# Patient Record
Sex: Female | Born: 1955
Health system: Southern US, Community
[De-identification: ages and names within clinical notes are randomized; demographics above are authoritative.]

## PROBLEM LIST (undated history)

## (undated) DIAGNOSIS — C801 Malignant (primary) neoplasm, unspecified: Secondary | ICD-10-CM

## (undated) HISTORY — PX: TUBAL LIGATION: SHX77

## (undated) HISTORY — PX: BREAST SURGERY: SHX581

## (undated) HISTORY — PX: ABDOMINAL HYSTERECTOMY: SHX81

## (undated) HISTORY — DX: Malignant (primary) neoplasm, unspecified: C80.1

---

## 2020-11-28 DIAGNOSIS — Z0000 Encounter for general adult medical examination without abnormal findings: Secondary | ICD-10-CM | POA: Diagnosis not present

## 2020-11-28 DIAGNOSIS — Z13220 Encounter for screening for lipoid disorders: Secondary | ICD-10-CM | POA: Diagnosis not present

## 2020-12-21 ENCOUNTER — Other Ambulatory Visit: Payer: Self-pay | Admitting: Family Medicine

## 2020-12-21 DIAGNOSIS — M810 Age-related osteoporosis without current pathological fracture: Secondary | ICD-10-CM

## 2020-12-27 ENCOUNTER — Ambulatory Visit
Admission: RE | Admit: 2020-12-27 | Discharge: 2020-12-27 | Disposition: A | Discharge status: Home / Self Care | Payer: BC Managed Care – PPO | Source: Ambulatory Visit | Attending: Family Medicine | Admitting: Family Medicine

## 2020-12-27 ENCOUNTER — Other Ambulatory Visit: Payer: Self-pay

## 2020-12-27 DIAGNOSIS — M810 Age-related osteoporosis without current pathological fracture: Secondary | ICD-10-CM | POA: Diagnosis not present

## 2020-12-27 DIAGNOSIS — M8588 Other specified disorders of bone density and structure, other site: Secondary | ICD-10-CM | POA: Diagnosis not present

## 2020-12-27 DIAGNOSIS — Z780 Asymptomatic menopausal state: Secondary | ICD-10-CM | POA: Diagnosis not present

## 2021-01-10 DIAGNOSIS — Z01818 Encounter for other preprocedural examination: Secondary | ICD-10-CM | POA: Diagnosis not present

## 2021-01-15 DIAGNOSIS — Z1211 Encounter for screening for malignant neoplasm of colon: Secondary | ICD-10-CM | POA: Diagnosis not present

## 2021-02-20 DIAGNOSIS — H5213 Myopia, bilateral: Secondary | ICD-10-CM | POA: Diagnosis not present

## 2021-02-20 DIAGNOSIS — H2513 Age-related nuclear cataract, bilateral: Secondary | ICD-10-CM | POA: Diagnosis not present

## 2021-02-20 DIAGNOSIS — H524 Presbyopia: Secondary | ICD-10-CM | POA: Diagnosis not present

## 2021-04-11 DIAGNOSIS — Z01419 Encounter for gynecological examination (general) (routine) without abnormal findings: Secondary | ICD-10-CM | POA: Diagnosis not present

## 2021-06-03 ENCOUNTER — Emergency Department (HOSPITAL_COMMUNITY): Payer: BC Managed Care – PPO

## 2021-06-03 ENCOUNTER — Other Ambulatory Visit: Payer: Self-pay

## 2021-06-03 ENCOUNTER — Emergency Department (HOSPITAL_COMMUNITY)
Admission: EM | Admit: 2021-06-03 | Discharge: 2021-06-03 | Disposition: A | Discharge status: Home / Self Care | Payer: BC Managed Care – PPO | Attending: Emergency Medicine | Admitting: Emergency Medicine

## 2021-06-03 ENCOUNTER — Encounter (HOSPITAL_COMMUNITY): Payer: Self-pay

## 2021-06-03 DIAGNOSIS — Z859 Personal history of malignant neoplasm, unspecified: Secondary | ICD-10-CM | POA: Diagnosis not present

## 2021-06-03 DIAGNOSIS — S59902A Unspecified injury of left elbow, initial encounter: Secondary | ICD-10-CM | POA: Diagnosis not present

## 2021-06-03 DIAGNOSIS — S52135A Nondisplaced fracture of neck of left radius, initial encounter for closed fracture: Secondary | ICD-10-CM | POA: Diagnosis not present

## 2021-06-03 DIAGNOSIS — W010XXA Fall on same level from slipping, tripping and stumbling without subsequent striking against object, initial encounter: Secondary | ICD-10-CM | POA: Insufficient documentation

## 2021-06-03 DIAGNOSIS — S50312A Abrasion of left elbow, initial encounter: Secondary | ICD-10-CM

## 2021-06-03 DIAGNOSIS — S52125A Nondisplaced fracture of head of left radius, initial encounter for closed fracture: Secondary | ICD-10-CM | POA: Diagnosis not present

## 2021-06-03 DIAGNOSIS — S52132A Displaced fracture of neck of left radius, initial encounter for closed fracture: Secondary | ICD-10-CM | POA: Diagnosis not present

## 2021-06-03 IMAGING — CR DG ELBOW COMPLETE 3+V*L*
4 series · 4 of 4 positions shown · non-contrast
Comparison: None.

CLINICAL DATA: Left elbow pain after fall.

EXAM:
LEFT ELBOW - COMPLETE 3+ VIEW

[x elbow obl left (1 of 2)]
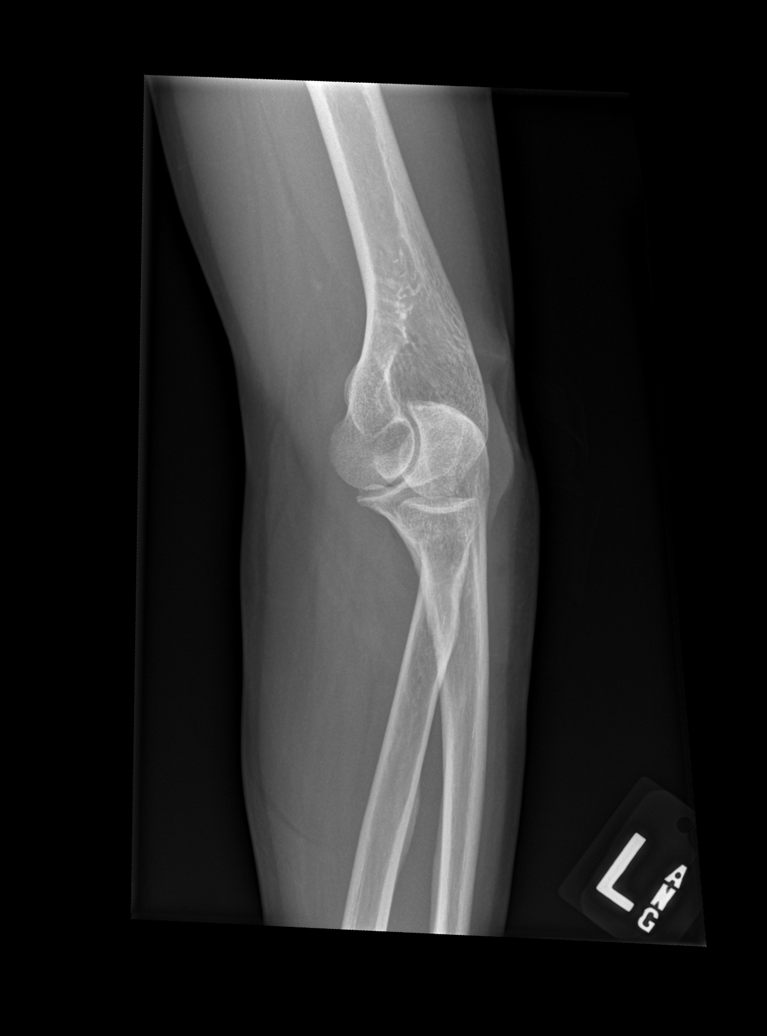

[x elbow lat left]
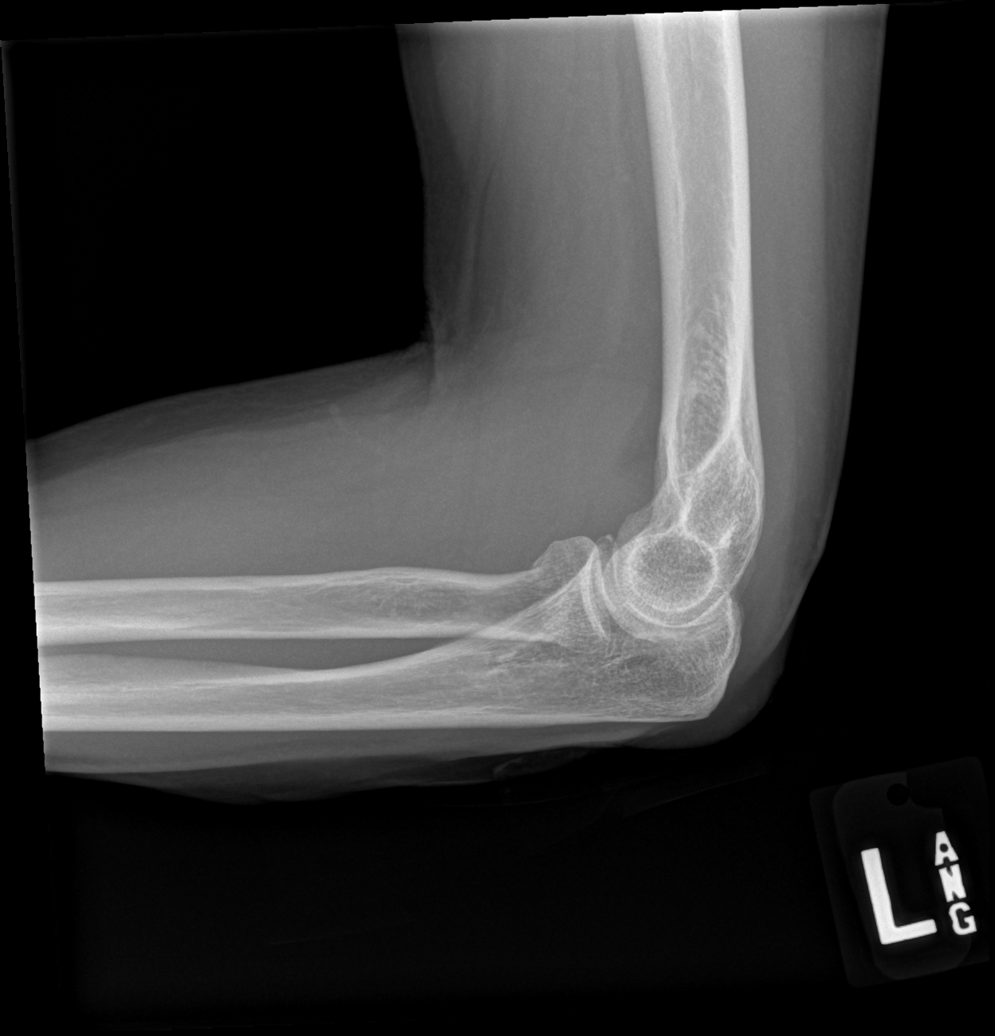

[x elbow ap left]
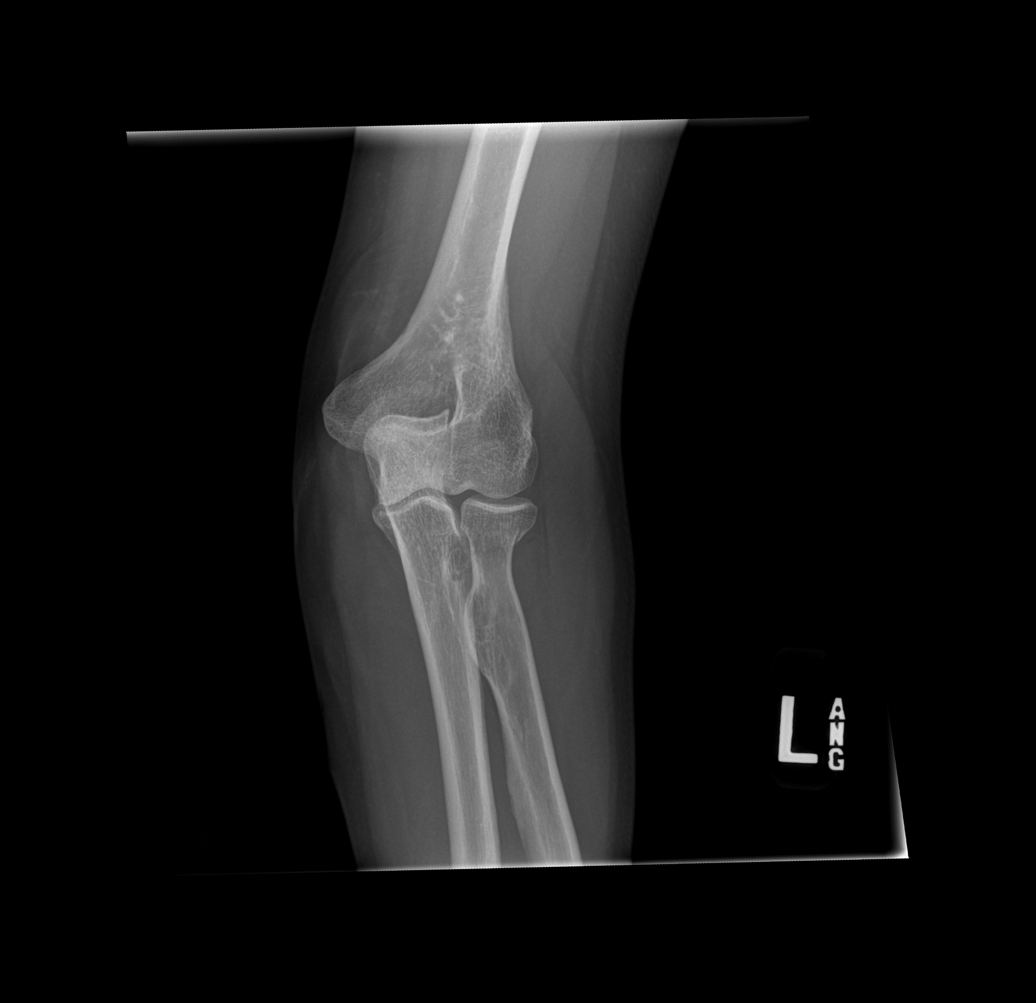

[x elbow obl left (2 of 2)]
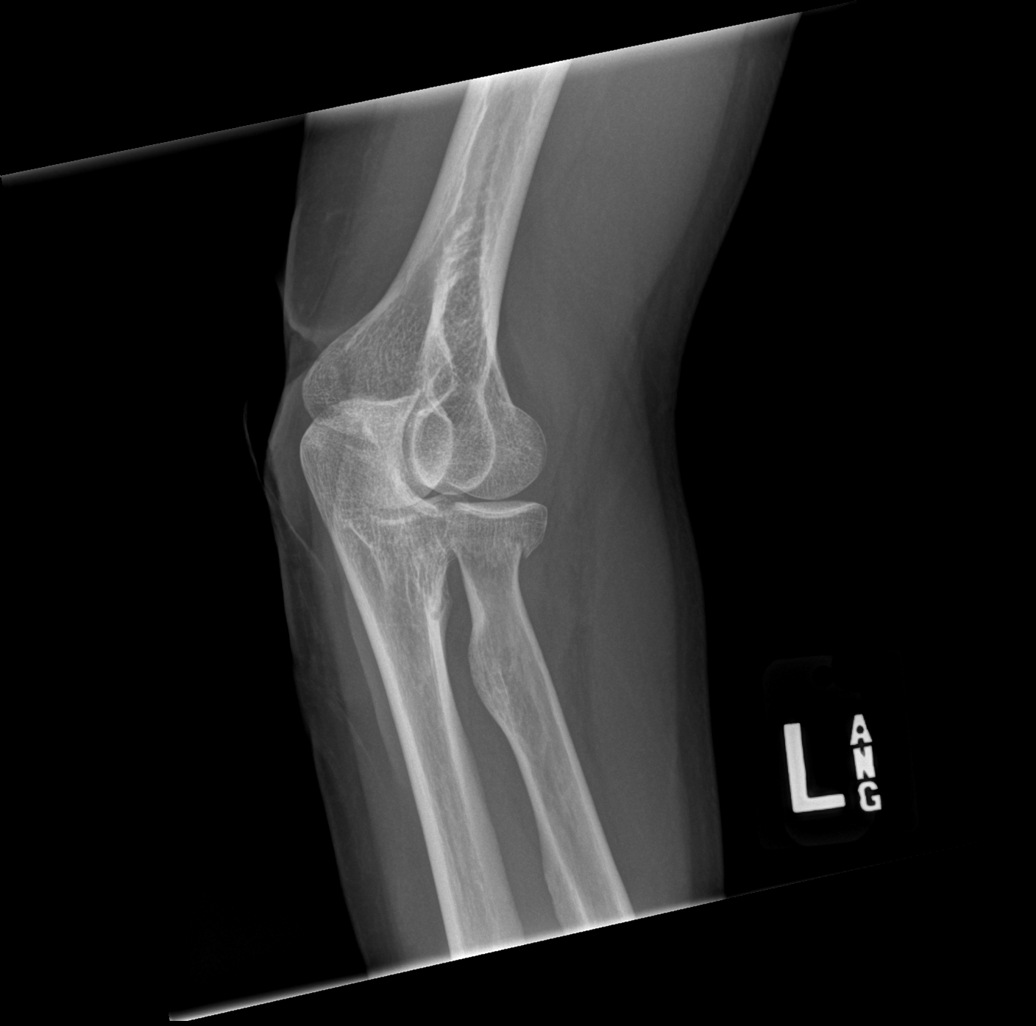

[4 of 4 positions shown; findings below may reference images not displayed]

FINDINGS: Acute minimally impacted fracture of the radial neck. No
dislocation. Small joint effusion. Joint spaces are preserved. Soft
tissues are unremarkable.
IMPRESSION: 1. Acute minimally impacted fracture of the radial neck.

## 2021-06-03 MED ORDER — HYDROCODONE-ACETAMINOPHEN 10-300 MG PO TABS: 1.0000 | ORAL_TABLET | Freq: Four times a day (QID) | ORAL | 0 refills | Status: AC | PRN

## 2021-06-03 NOTE — ED Notes (Signed)
Patient transported to X-ray 

## 2021-06-03 NOTE — ED Provider Notes (Addendum)
MSE note.  Patient fell and injured her left elbow.  Physical exam patient has an abrasion to the left elbow and has decreased strenght gripping objects.  We will get an x-ray of her elbow but it does not seem to be fractured or dislocated   Milton Ferguson, MD 06/03/21 1645    Milton Ferguson, MD 06/03/21 [phone]

## 2021-06-03 NOTE — ED Provider Notes (Signed)
[facility] DEPT Provider Note   CSN: [id number] Arrival date & time: 06/03/21  1624     History No chief complaint on file.   Haley Castillo is a 65 y.o. female.  She fell outside in the yard and landed on her left elbow and scraped her left knee.  No loss of consciousness.  She says it hurts when she moves her arm and specific directions.  She has an abrasion over her elbow.  Her tetanus is up-to-date.  No numbness or weakness.  No head or neck pain.   The history is provided by the patient.  Arm Injury Location:  Elbow Elbow location:  L elbow Injury: yes   Time since incident:  1 hour Mechanism of injury: fall   Fall:    Fall occurred:  [employer] of impact: l elbow. Pain details:    Quality:  Shooting   Radiates to:  L upper arm and L forearm   Severity:  Moderate   Onset quality:  Sudden   Timing:  Intermittent   Progression:  Unchanged Handedness:  Right-handed Dislocation: no   Prior injury to area:  No Relieved by:  None tried Worsened by:  Movement Ineffective treatments:  None tried Associated symptoms: no decreased range of motion, no fever, no muscle weakness, no neck pain, no numbness and no tingling       Past Medical History:  Diagnosis Date   Cancer ([place])     There are no problems to display for this patient.   Past Surgical History:  Procedure Laterality Date   ABDOMINAL HYSTERECTOMY     BREAST SURGERY     TUBAL LIGATION       OB History   No obstetric history on file.     History reviewed. No pertinent family history.  Social History   Tobacco Use   Smoking status: Never   Smokeless tobacco: Never  Vaping Use   Vaping Use: Never used  Substance Use Topics   Alcohol use: Yes   Drug use: Never    Home Medications Prior to Admission medications   Not on File    Allergies    Patient has no known allergies.  Review of Systems   Review of Systems  Constitutional:  Negative for  fever.  Musculoskeletal:  Negative for neck pain.  Skin:  Positive for wound.  Neurological:  Negative for weakness and numbness.   Physical Exam Updated Vital Signs BP (!) 153/106 (BP Location: Left Arm)   Pulse 97   Temp 98.1 F (36.7 C) (Oral)   Resp 14   Ht 5' 3.5" (1.613 m)   Wt 49.9 kg   LMP  (Exact Date)   SpO2 100%   BMI 19.18 kg/m   Physical Exam Constitutional:      Appearance: Normal appearance. She is well-developed.  HENT:     Head: Normocephalic and atraumatic.  Eyes:     Conjunctiva/sclera: Conjunctivae normal.  Musculoskeletal:        General: Tenderness and signs of injury present. No swelling. Normal range of motion.     Cervical back: Neck supple.     Comments: Since left shoulder and upper arm are nontender.  Elbow is a little bit tender at the radial head and olecranon.  She has a small abrasion over her elbow.  Forearm and wrist and hand are nontender.  Normal radial ulnar and median motor and sensory intact.  Radial pulse 2+.  Skin:  General: Skin is warm and dry.  Neurological:     General: No focal deficit present.     Mental Status: She is alert.     GCS: GCS eye subscore is 4. GCS verbal subscore is 5. GCS motor subscore is 6.    ED Results / Procedures / Treatments   Labs (all labs ordered are listed, but only abnormal results are displayed) Labs Reviewed - No data to display  EKG None  Radiology DG Elbow Complete Left  Result Date: 06/03/2021 CLINICAL DATA:  Left elbow pain after fall. EXAM: LEFT ELBOW - COMPLETE 3+ VIEW COMPARISON:  None. FINDINGS: Acute minimally impacted fracture of the radial neck. No dislocation. Small joint effusion. Joint spaces are preserved. Soft tissues are unremarkable. IMPRESSION: 1. Acute minimally impacted fracture of the radial neck. Electronically Signed   By: Titus Dubin M.D.   On: 06/03/2021 17:06    Procedures Procedures   Medications Ordered in ED Medications - No data to display  ED  Course  I have reviewed the triage vital signs and the nursing notes.  Pertinent labs & imaging results that were available during my care of the patient were reviewed by me and considered in my medical decision making (see chart for details).  Clinical Course as of 06/04/21 6203  Sun Jun 03, 2021  1725 Discussed with Dr. Alvan Dame orthopedics on-call.  He said a sling would be fine and have the patient follow-up this week with either Dr. Apolonio Schneiders or Dr. Amedeo Plenty. [MB]    Clinical Course User Index [MB] Hayden Rasmussen, MD   MDM Rules/Calculators/A&P                         Left elbow pain after fall.  Differential includes fracture, dislocation, contusion.  Left elbow x-ray ordered and interpreted by me as radial neck fracture.  Discussed with orthopedics.  Reviewed with patient recommendations per orthopedics, sling and outpatient follow-up.  Provide short prescription for some pain medication if needed.  Return instructions discussed.  Final Clinical Impression(s) / ED Diagnoses Final diagnoses:  Closed nondisplaced fracture of neck of left radius, initial encounter  Abrasion of left elbow, initial encounter    Rx / DC Orders ED Discharge Orders     None        Hayden Rasmussen, MD 06/04/21 0930

## 2021-06-03 NOTE — Discharge Instructions (Addendum)
You are seen in the emergency room for evaluation of injuries from a fall.  You had x-rays of your left elbow that showed a radial neck fracture.  Orthopedics on-call recommends you call the office Monday to get seen early this week by either Dr. Amedeo Plenty or Dr. Apolonio Schneiders.  Tylenol and ibuprofen for pain.  Ice may also help.  Sling for comfort.

## 2021-06-03 NOTE — ED Triage Notes (Addendum)
Patient reports that she tripped over a small fence in her backward and fell, landing on her left elbow and knee. patient did not hit her head or have LOC. Patient has an abrasion to he left knee and left elbow.Patient states she is able to bend her left arm at the elbow, but is unable to grip with her left hand.

## 2021-06-05 ENCOUNTER — Telehealth (HOSPITAL_COMMUNITY): Payer: Self-pay | Admitting: Emergency Medicine

## 2021-06-05 MED ORDER — OXYCODONE-ACETAMINOPHEN 10-325 MG PO TABS
0.5000 | ORAL_TABLET | Freq: Four times a day (QID) | ORAL | 0 refills | Status: AC | PRN
Start: 2021-06-05 — End: 2021-06-08

## 2021-06-05 NOTE — Telephone Encounter (Signed)
This encounter was created to provide alternative medication.  The medicine that was originally prescribed for patient is no longer available in pharmacy.  Equivalent will be sent in.

## 2021-06-06 DIAGNOSIS — S52135A Nondisplaced fracture of neck of left radius, initial encounter for closed fracture: Secondary | ICD-10-CM | POA: Diagnosis not present

## 2021-06-28 DIAGNOSIS — M25522 Pain in left elbow: Secondary | ICD-10-CM | POA: Diagnosis not present

## 2021-06-28 DIAGNOSIS — S52135D Nondisplaced fracture of neck of left radius, subsequent encounter for closed fracture with routine healing: Secondary | ICD-10-CM | POA: Diagnosis not present

## 2021-07-12 DIAGNOSIS — S52135D Nondisplaced fracture of neck of left radius, subsequent encounter for closed fracture with routine healing: Secondary | ICD-10-CM | POA: Diagnosis not present

## 2021-08-09 DIAGNOSIS — S52135D Nondisplaced fracture of neck of left radius, subsequent encounter for closed fracture with routine healing: Secondary | ICD-10-CM | POA: Diagnosis not present

## 2021-12-17 DIAGNOSIS — Z13220 Encounter for screening for lipoid disorders: Secondary | ICD-10-CM | POA: Diagnosis not present

## 2021-12-17 DIAGNOSIS — R030 Elevated blood-pressure reading, without diagnosis of hypertension: Secondary | ICD-10-CM | POA: Diagnosis not present

## 2021-12-17 DIAGNOSIS — Z0000 Encounter for general adult medical examination without abnormal findings: Secondary | ICD-10-CM | POA: Diagnosis not present

## 2022-02-21 DIAGNOSIS — H5213 Myopia, bilateral: Secondary | ICD-10-CM | POA: Diagnosis not present

## 2022-02-21 DIAGNOSIS — H2513 Age-related nuclear cataract, bilateral: Secondary | ICD-10-CM | POA: Diagnosis not present

## 2022-03-22 DIAGNOSIS — Z1211 Encounter for screening for malignant neoplasm of colon: Secondary | ICD-10-CM | POA: Diagnosis not present

## 2022-03-22 DIAGNOSIS — K5730 Diverticulosis of large intestine without perforation or abscess without bleeding: Secondary | ICD-10-CM | POA: Diagnosis not present

## 2022-03-22 DIAGNOSIS — K649 Unspecified hemorrhoids: Secondary | ICD-10-CM | POA: Diagnosis not present

## 2022-04-15 DIAGNOSIS — Z01419 Encounter for gynecological examination (general) (routine) without abnormal findings: Secondary | ICD-10-CM | POA: Diagnosis not present

## 2023-01-14 DIAGNOSIS — R7301 Impaired fasting glucose: Secondary | ICD-10-CM | POA: Diagnosis not present

## 2023-01-14 DIAGNOSIS — Z0000 Encounter for general adult medical examination without abnormal findings: Secondary | ICD-10-CM | POA: Diagnosis not present

## 2023-01-14 DIAGNOSIS — Z23 Encounter for immunization: Secondary | ICD-10-CM | POA: Diagnosis not present

## 2023-01-14 DIAGNOSIS — Z13220 Encounter for screening for lipoid disorders: Secondary | ICD-10-CM | POA: Diagnosis not present

## 2023-01-14 DIAGNOSIS — R030 Elevated blood-pressure reading, without diagnosis of hypertension: Secondary | ICD-10-CM | POA: Diagnosis not present

## 2023-01-14 DIAGNOSIS — F419 Anxiety disorder, unspecified: Secondary | ICD-10-CM | POA: Diagnosis not present

## 2023-02-26 DIAGNOSIS — H2513 Age-related nuclear cataract, bilateral: Secondary | ICD-10-CM | POA: Diagnosis not present

## 2023-02-26 DIAGNOSIS — H5213 Myopia, bilateral: Secondary | ICD-10-CM | POA: Diagnosis not present

## 2023-02-26 DIAGNOSIS — H524 Presbyopia: Secondary | ICD-10-CM | POA: Diagnosis not present

## 2023-06-02 ENCOUNTER — Other Ambulatory Visit (HOSPITAL_COMMUNITY): Payer: Self-pay

## 2023-06-02 MED ORDER — HYDROCHLOROTHIAZIDE 25 MG PO TABS
25.0000 mg | ORAL_TABLET | Freq: Every morning | ORAL | 1 refills | Status: AC
  Filled 2023-06-02: qty 30, 30d supply, fill #0
  Filled 2023-07-01: qty 30, 30d supply, fill #1
  Filled 2023-07-24: qty 30, 30d supply, fill #2

## 2023-07-24 ENCOUNTER — Other Ambulatory Visit (HOSPITAL_COMMUNITY): Payer: Self-pay

## 2023-07-25 ENCOUNTER — Other Ambulatory Visit (HOSPITAL_COMMUNITY): Payer: Self-pay

## 2023-08-13 DIAGNOSIS — Z01419 Encounter for gynecological examination (general) (routine) without abnormal findings: Secondary | ICD-10-CM | POA: Diagnosis not present

## 2023-08-27 ENCOUNTER — Other Ambulatory Visit (HOSPITAL_COMMUNITY): Payer: Self-pay

## 2023-08-27 DIAGNOSIS — Z23 Encounter for immunization: Secondary | ICD-10-CM | POA: Diagnosis not present

## 2023-08-27 DIAGNOSIS — I10 Essential (primary) hypertension: Secondary | ICD-10-CM | POA: Diagnosis not present

## 2023-08-27 DIAGNOSIS — F419 Anxiety disorder, unspecified: Secondary | ICD-10-CM | POA: Diagnosis not present

## 2023-08-27 MED ORDER — LORAZEPAM 0.5 MG PO TABS
0.2500 mg | ORAL_TABLET | Freq: Every day | ORAL | 0 refills | Status: AC | PRN
  Filled 2023-08-27: qty 15, 15d supply, fill #0

## 2023-08-27 MED ORDER — AMLODIPINE BESYLATE 5 MG PO TABS
5.0000 mg | ORAL_TABLET | Freq: Every day | ORAL | 0 refills | Status: AC
  Filled 2023-08-27: qty 30, 30d supply, fill #0

## 2023-08-28 ENCOUNTER — Other Ambulatory Visit (HOSPITAL_COMMUNITY): Payer: Self-pay

## 2023-09-06 ENCOUNTER — Other Ambulatory Visit: Payer: Self-pay | Admitting: Obstetrics and Gynecology

## 2023-09-06 DIAGNOSIS — R5381 Other malaise: Secondary | ICD-10-CM

## 2023-09-26 ENCOUNTER — Other Ambulatory Visit: Payer: Self-pay | Admitting: Obstetrics and Gynecology

## 2023-09-26 ENCOUNTER — Other Ambulatory Visit (HOSPITAL_COMMUNITY): Payer: Self-pay

## 2023-09-26 DIAGNOSIS — M810 Age-related osteoporosis without current pathological fracture: Secondary | ICD-10-CM

## 2023-09-26 DIAGNOSIS — I10 Essential (primary) hypertension: Secondary | ICD-10-CM | POA: Diagnosis not present

## 2023-09-26 MED ORDER — AMLODIPINE BESYLATE 10 MG PO TABS
10.0000 mg | ORAL_TABLET | Freq: Every day | ORAL | 0 refills | Status: DC
  Filled 2023-09-26: qty 30, 30d supply, fill #0

## 2023-09-29 ENCOUNTER — Other Ambulatory Visit (HOSPITAL_COMMUNITY): Payer: Self-pay

## 2023-10-07 ENCOUNTER — Other Ambulatory Visit (HOSPITAL_BASED_OUTPATIENT_CLINIC_OR_DEPARTMENT_OTHER): Payer: Self-pay

## 2023-10-07 ENCOUNTER — Other Ambulatory Visit (HOSPITAL_COMMUNITY): Payer: Self-pay

## 2023-10-07 MED ORDER — FUROSEMIDE 20 MG PO TABS
20.0000 mg | ORAL_TABLET | Freq: Every day | ORAL | 0 refills | Status: AC
  Filled 2023-10-07: qty 30, 30d supply, fill #0

## 2023-10-27 ENCOUNTER — Other Ambulatory Visit (HOSPITAL_COMMUNITY): Payer: Self-pay

## 2023-10-27 MED ORDER — FUROSEMIDE 20 MG PO TABS
20.0000 mg | ORAL_TABLET | Freq: Two times a day (BID) | ORAL | 1 refills | Status: AC
  Filled 2023-10-27: qty 60, 30d supply, fill #0

## 2023-10-27 MED ORDER — AMLODIPINE BESYLATE 10 MG PO TABS
10.0000 mg | ORAL_TABLET | Freq: Every day | ORAL | 1 refills | Status: DC
  Filled 2023-10-27: qty 30, 30d supply, fill #0

## 2023-10-28 ENCOUNTER — Other Ambulatory Visit (HOSPITAL_COMMUNITY): Payer: Self-pay

## 2023-10-29 ENCOUNTER — Other Ambulatory Visit (HOSPITAL_COMMUNITY): Payer: Self-pay

## 2023-11-07 ENCOUNTER — Other Ambulatory Visit (HOSPITAL_COMMUNITY): Payer: Self-pay

## 2023-11-07 MED ORDER — COVID-19 MRNA VAC-TRIS(PFIZER) 30 MCG/0.3ML IM SUSY
0.3000 mL | PREFILLED_SYRINGE | Freq: Once | INTRAMUSCULAR | 0 refills | Status: AC
  Filled 2023-11-07: qty 0.3, 1d supply, fill #0

## 2023-11-20 ENCOUNTER — Other Ambulatory Visit (HOSPITAL_COMMUNITY): Payer: Self-pay

## 2023-11-20 MED ORDER — LISINOPRIL 20 MG PO TABS
20.0000 mg | ORAL_TABLET | Freq: Every day | ORAL | 0 refills | Status: DC
  Filled 2023-11-20: qty 30, 30d supply, fill #0

## 2023-11-21 ENCOUNTER — Other Ambulatory Visit (HOSPITAL_COMMUNITY): Payer: Self-pay

## 2023-12-13 ENCOUNTER — Other Ambulatory Visit (HOSPITAL_COMMUNITY): Payer: Self-pay

## 2023-12-13 DIAGNOSIS — Z03818 Encounter for observation for suspected exposure to other biological agents ruled out: Secondary | ICD-10-CM | POA: Diagnosis not present

## 2023-12-13 DIAGNOSIS — J029 Acute pharyngitis, unspecified: Secondary | ICD-10-CM | POA: Diagnosis not present

## 2023-12-13 MED ORDER — AMOXICILLIN-POT CLAVULANATE 875-125 MG PO TABS
1.0000 | ORAL_TABLET | Freq: Two times a day (BID) | ORAL | 0 refills | Status: AC
  Filled 2023-12-13: qty 14, 7d supply, fill #0

## 2023-12-22 ENCOUNTER — Other Ambulatory Visit (HOSPITAL_COMMUNITY): Payer: Self-pay

## 2023-12-22 MED ORDER — LISINOPRIL 20 MG PO TABS
20.0000 mg | ORAL_TABLET | Freq: Every day | ORAL | 0 refills | Status: DC
  Filled 2023-12-22: qty 30, 30d supply, fill #0
  Filled 2024-01-18: qty 30, 30d supply, fill #1

## 2023-12-23 ENCOUNTER — Other Ambulatory Visit (HOSPITAL_COMMUNITY): Payer: Self-pay

## 2024-01-03 ENCOUNTER — Other Ambulatory Visit (HOSPITAL_COMMUNITY): Payer: Self-pay

## 2024-01-03 DIAGNOSIS — J019 Acute sinusitis, unspecified: Secondary | ICD-10-CM | POA: Diagnosis not present

## 2024-01-03 MED ORDER — DOXYCYCLINE HYCLATE 100 MG PO TABS
100.0000 mg | ORAL_TABLET | Freq: Two times a day (BID) | ORAL | 0 refills | Status: AC
  Filled 2024-01-03: qty 20, 10d supply, fill #0

## 2024-01-03 MED ORDER — FLUTICASONE PROPIONATE 50 MCG/ACT NA SUSP
1.0000 | Freq: Two times a day (BID) | NASAL | 0 refills | Status: AC
  Filled 2024-01-03: qty 16, 25d supply, fill #0

## 2024-02-16 ENCOUNTER — Other Ambulatory Visit (HOSPITAL_COMMUNITY): Payer: Self-pay

## 2024-02-16 MED ORDER — LOSARTAN POTASSIUM 50 MG PO TABS
50.0000 mg | ORAL_TABLET | Freq: Every day | ORAL | 0 refills | Status: DC
  Filled 2024-02-16: qty 30, 30d supply, fill #0

## 2024-03-02 DIAGNOSIS — H2513 Age-related nuclear cataract, bilateral: Secondary | ICD-10-CM | POA: Diagnosis not present

## 2024-03-03 ENCOUNTER — Other Ambulatory Visit (HOSPITAL_COMMUNITY): Payer: Self-pay

## 2024-03-03 DIAGNOSIS — I10 Essential (primary) hypertension: Secondary | ICD-10-CM | POA: Diagnosis not present

## 2024-03-03 DIAGNOSIS — F419 Anxiety disorder, unspecified: Secondary | ICD-10-CM | POA: Diagnosis not present

## 2024-03-03 DIAGNOSIS — Z13220 Encounter for screening for lipoid disorders: Secondary | ICD-10-CM | POA: Diagnosis not present

## 2024-03-03 DIAGNOSIS — Z0000 Encounter for general adult medical examination without abnormal findings: Secondary | ICD-10-CM | POA: Diagnosis not present

## 2024-03-03 MED ORDER — LOSARTAN POTASSIUM 100 MG PO TABS
100.0000 mg | ORAL_TABLET | Freq: Every day | ORAL | 0 refills | Status: DC
  Filled 2024-03-03: qty 30, 30d supply, fill #0

## 2024-03-06 ENCOUNTER — Other Ambulatory Visit (HOSPITAL_COMMUNITY): Payer: Self-pay

## 2024-04-02 ENCOUNTER — Other Ambulatory Visit (HOSPITAL_COMMUNITY): Payer: Self-pay

## 2024-04-02 MED ORDER — LOSARTAN POTASSIUM 100 MG PO TABS
100.0000 mg | ORAL_TABLET | Freq: Every day | ORAL | 1 refills | Status: DC
  Filled 2024-04-02: qty 30, 30d supply, fill #0
  Filled 2024-05-03: qty 30, 30d supply, fill #1

## 2024-04-13 ENCOUNTER — Other Ambulatory Visit (HOSPITAL_COMMUNITY): Payer: Self-pay

## 2024-04-13 MED ORDER — LORAZEPAM 0.5 MG PO TABS
0.2500 mg | ORAL_TABLET | Freq: Every day | ORAL | 0 refills | Status: AC | PRN
  Filled 2024-04-13: qty 15, 15d supply, fill #0

## 2024-05-04 ENCOUNTER — Ambulatory Visit
Admission: RE | Admit: 2024-05-04 | Discharge: 2024-05-04 | Disposition: A | Discharge status: Home / Self Care | Payer: BC Managed Care – PPO | Source: Ambulatory Visit | Attending: Obstetrics and Gynecology | Admitting: Obstetrics and Gynecology

## 2024-05-04 DIAGNOSIS — N958 Other specified menopausal and perimenopausal disorders: Secondary | ICD-10-CM | POA: Diagnosis not present

## 2024-05-04 DIAGNOSIS — M8588 Other specified disorders of bone density and structure, other site: Secondary | ICD-10-CM | POA: Diagnosis not present

## 2024-05-04 DIAGNOSIS — M810 Age-related osteoporosis without current pathological fracture: Secondary | ICD-10-CM

## 2024-05-31 ENCOUNTER — Other Ambulatory Visit (HOSPITAL_COMMUNITY): Payer: Self-pay

## 2024-06-02 ENCOUNTER — Other Ambulatory Visit (HOSPITAL_COMMUNITY): Payer: Self-pay

## 2024-06-02 MED ORDER — LOSARTAN POTASSIUM 100 MG PO TABS
100.0000 mg | ORAL_TABLET | Freq: Every day | ORAL | 3 refills | Status: AC
  Filled 2024-06-02: qty 30, 30d supply, fill #0
  Filled 2024-06-29: qty 30, 30d supply, fill #1

## 2024-07-20 ENCOUNTER — Other Ambulatory Visit (HOSPITAL_COMMUNITY): Payer: Self-pay

## 2024-07-20 MED ORDER — LORAZEPAM 0.5 MG PO TABS
0.2500 mg | ORAL_TABLET | Freq: Every day | ORAL | 0 refills | Status: AC | PRN
  Filled 2024-07-20: qty 15, 15d supply, fill #0

## 2024-08-02 ENCOUNTER — Other Ambulatory Visit (HOSPITAL_COMMUNITY): Payer: Self-pay

## 2024-09-03 DIAGNOSIS — I10 Essential (primary) hypertension: Secondary | ICD-10-CM | POA: Diagnosis not present

## 2024-11-03 ENCOUNTER — Other Ambulatory Visit (HOSPITAL_COMMUNITY): Payer: Self-pay

## 2024-11-03 MED ORDER — FLUZONE HIGH-DOSE 0.5 ML IM SUSY
0.5000 mL | PREFILLED_SYRINGE | INTRAMUSCULAR | 0 refills | Status: AC
  Filled 2024-11-03: qty 0.5, 1d supply, fill #0

## 2024-11-03 MED ORDER — COVID-19 MRNA VAC-TRIS(PFIZER) 30 MCG/0.3ML IM SUSY
0.3000 mL | PREFILLED_SYRINGE | Freq: Once | INTRAMUSCULAR | 0 refills | Status: AC
  Filled 2024-11-03: qty 0.3, 1d supply, fill #0
